# Patient Record
Sex: Female | Born: 1935 | Race: White | Hispanic: No | Marital: Single
Health system: Southern US, Community
[De-identification: ages and names within clinical notes are randomized; demographics above are authoritative.]

---

## 2007-01-19 ENCOUNTER — Ambulatory Visit: Payer: Self-pay | Admitting: Internal Medicine

## 2008-06-05 ENCOUNTER — Inpatient Hospital Stay (HOSPITAL_COMMUNITY): Admission: EM | Admit: 2008-06-05 | Discharge: 2008-06-14 | Payer: Self-pay | Admitting: Emergency Medicine

## 2008-06-14 ENCOUNTER — Inpatient Hospital Stay: Admission: AD | Admit: 2008-06-14 | Discharge: 2008-08-28 | Payer: Self-pay | Admitting: Internal Medicine

## 2008-07-11 ENCOUNTER — Encounter: Admission: RE | Admit: 2008-07-11 | Discharge: 2008-07-11 | Payer: Self-pay | Admitting: Neurosurgery

## 2008-08-29 ENCOUNTER — Encounter: Admission: RE | Admit: 2008-08-29 | Discharge: 2008-08-29 | Payer: Self-pay | Admitting: Neurosurgery

## 2009-09-30 IMAGING — CR DG CHEST 1V PORT
1 series · 1 of 1 positions shown · non-contrast
Comparison: 06/05/2008

CLINICAL DATA: Rib fracture

PORTABLE CHEST - 1 VIEW

[AP]
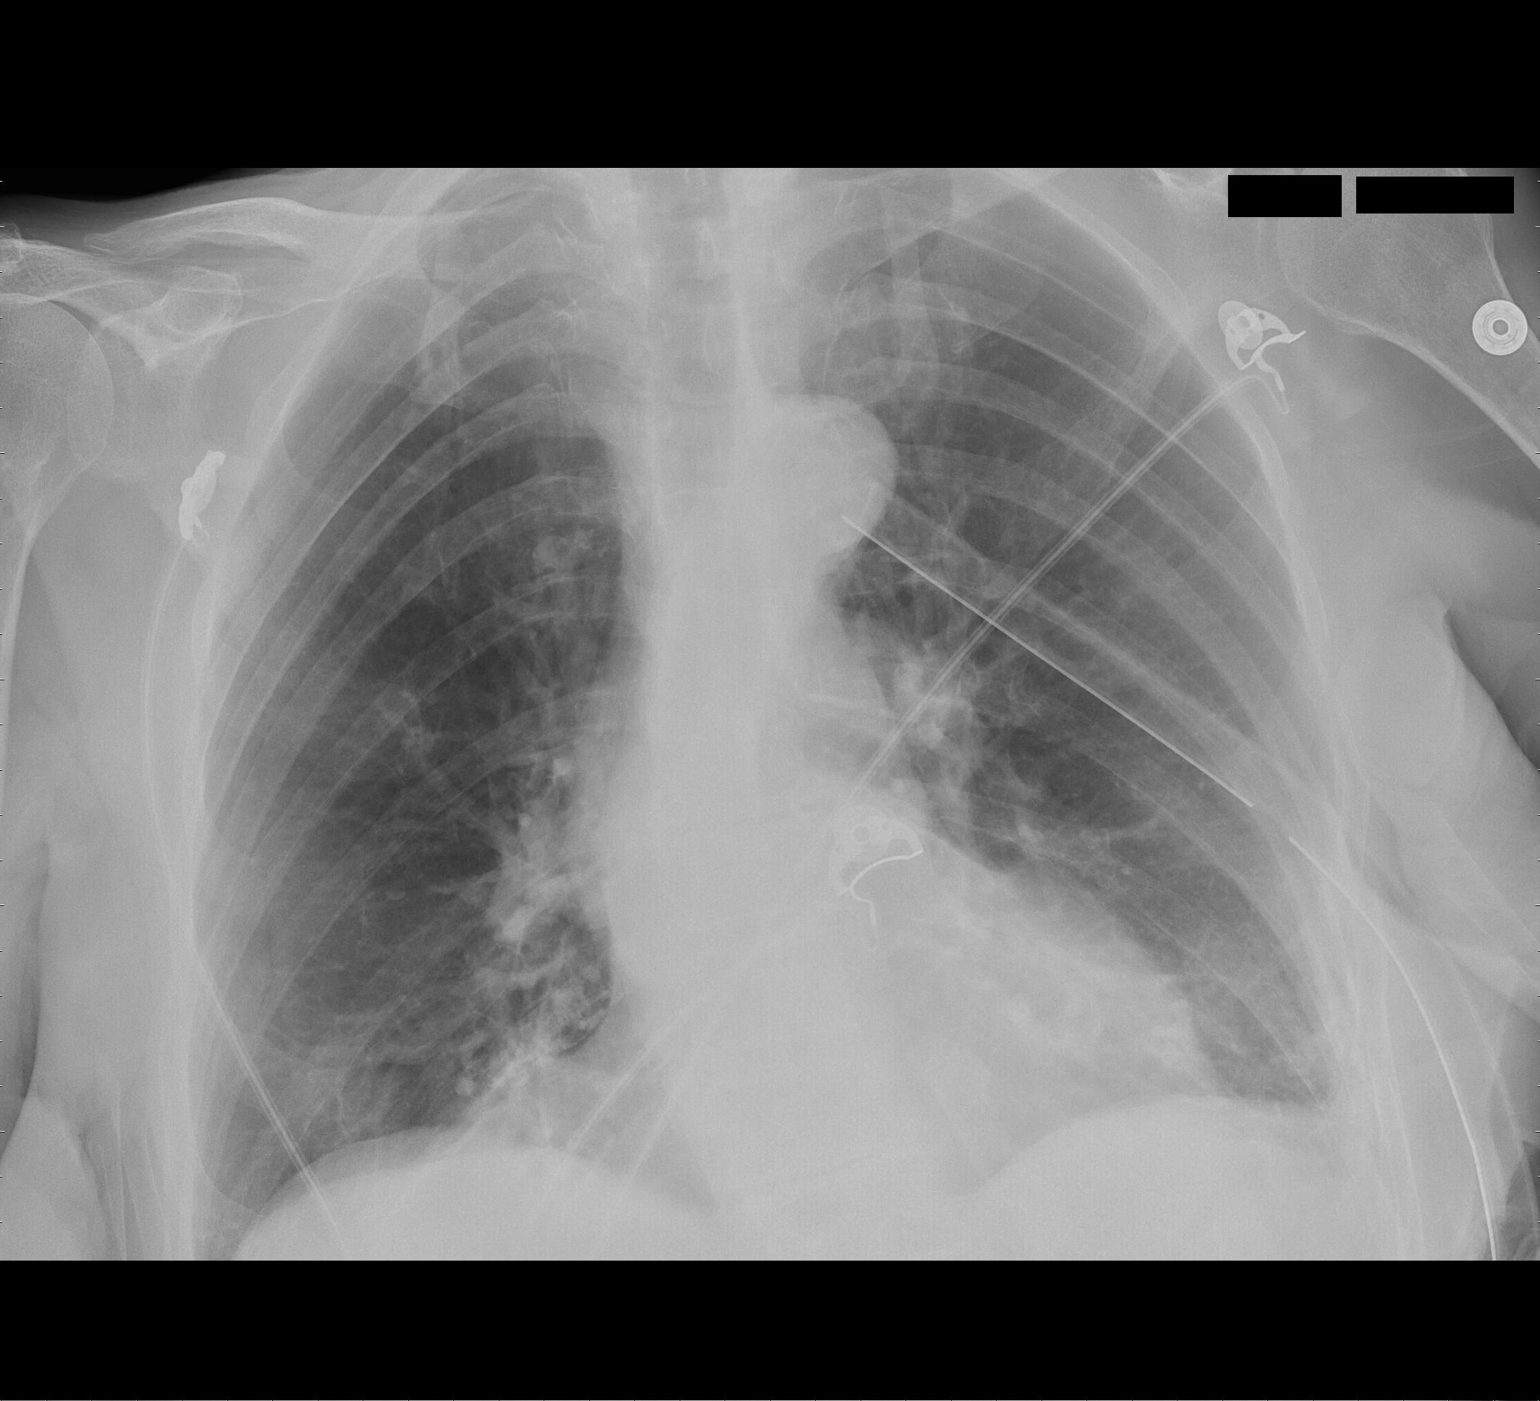

[1 of 1 positions shown; findings below may reference images not displayed]

FINDINGS: The left chest tube is stable and there is no
pneumothorax.  Left rib fractures are noted.  The heart is normal
in size.  Minimal left basilar atelectasis and a left pleural
effusion are stable.
IMPRESSION: No significant interval change.

## 2009-10-01 IMAGING — CR DG CHEST 1V PORT
1 series · 1 of 1 positions shown · non-contrast
Comparison: Portable chest of [DATE] and 06/05/2008

CLINICAL DATA: Cervical fracture, left tib-fib fracture,
pneumothorax, follow-up

PORTABLE CHEST - 1 VIEW

[view not recorded]
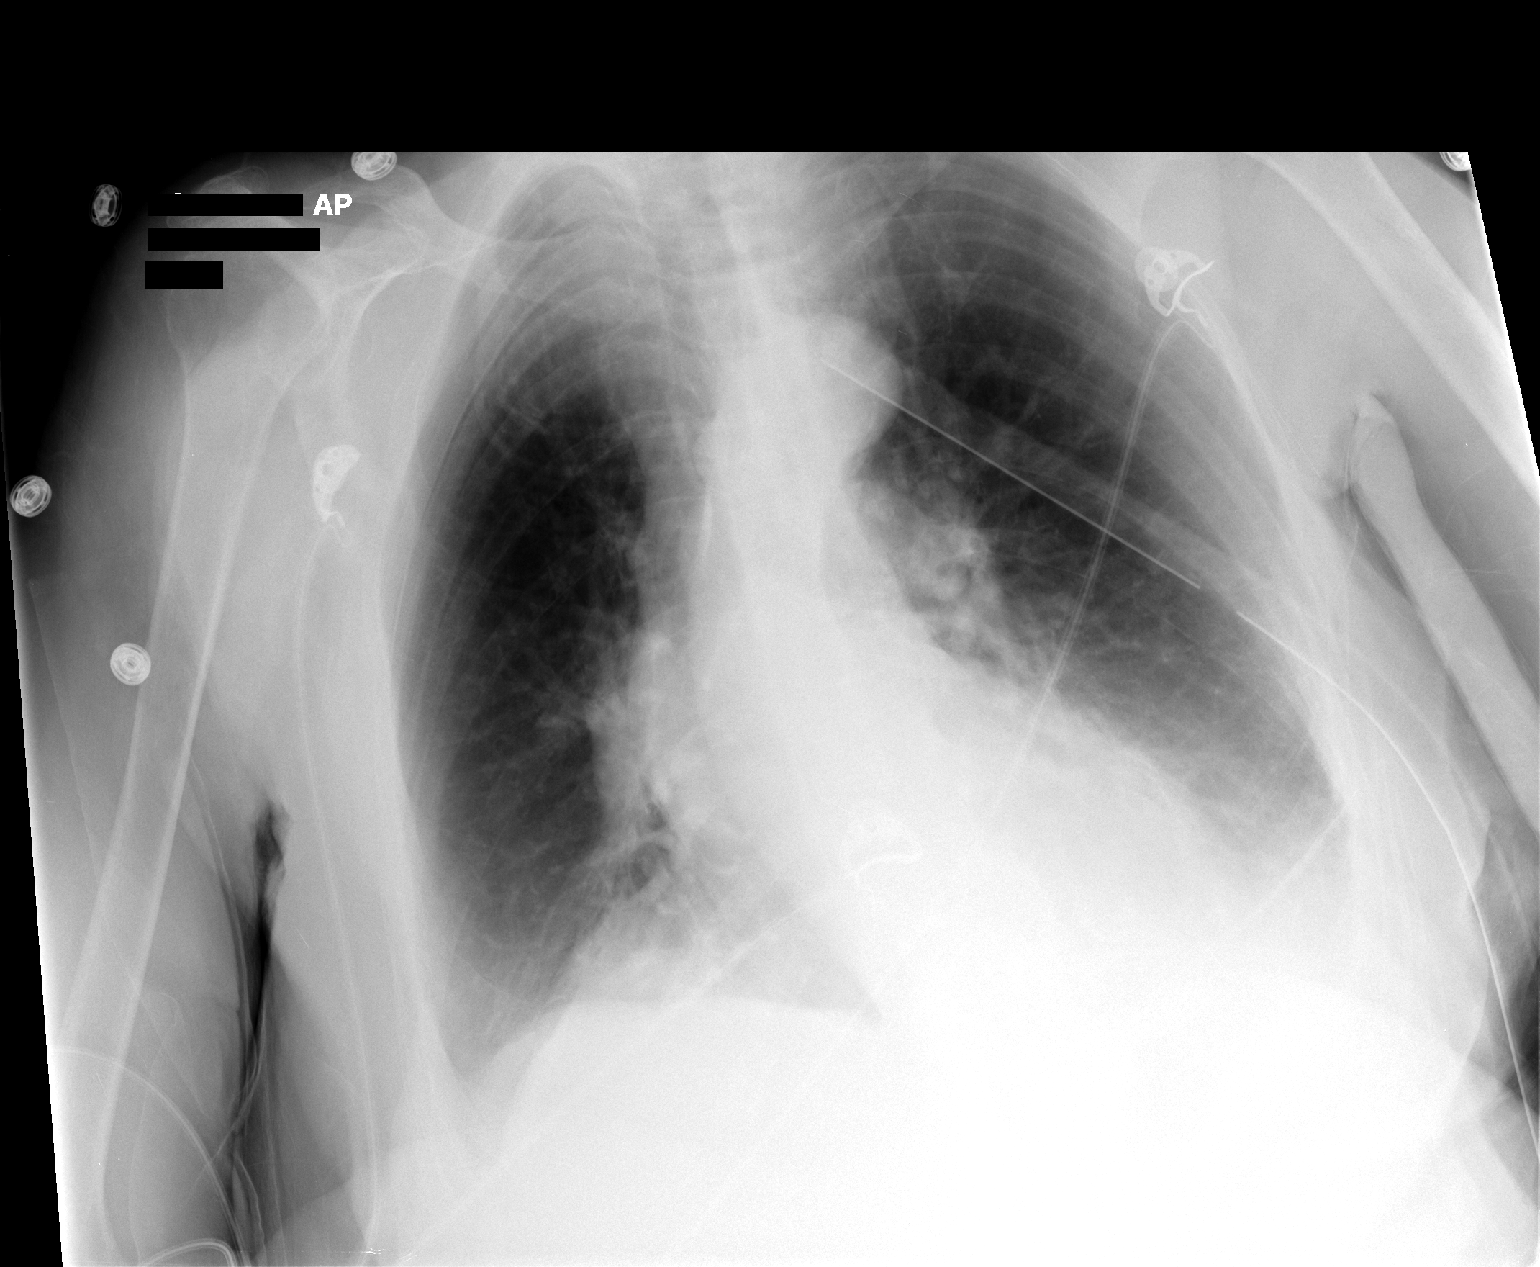

[1 of 1 positions shown; findings below may reference images not displayed]

FINDINGS: There is haziness at the lung bases left greater than
right which may reflect atelectasis and possible effusion.  The
heart is within upper limits of normal.  Left chest tube remains
and no pneumothorax is seen.
IMPRESSION: Atelectasis and probable small effusions left greater than right.
Left chest tube remains with no definite pneumothorax.

## 2009-10-02 IMAGING — CR DG CHEST 1V PORT
1 series · 1 of 1 positions shown · non-contrast
Comparison: 06/07/2008

CLINICAL DATA: 82-year-old female left chest tube, follow-up exam

PORTABLE CHEST - 1 VIEW

[view not recorded]
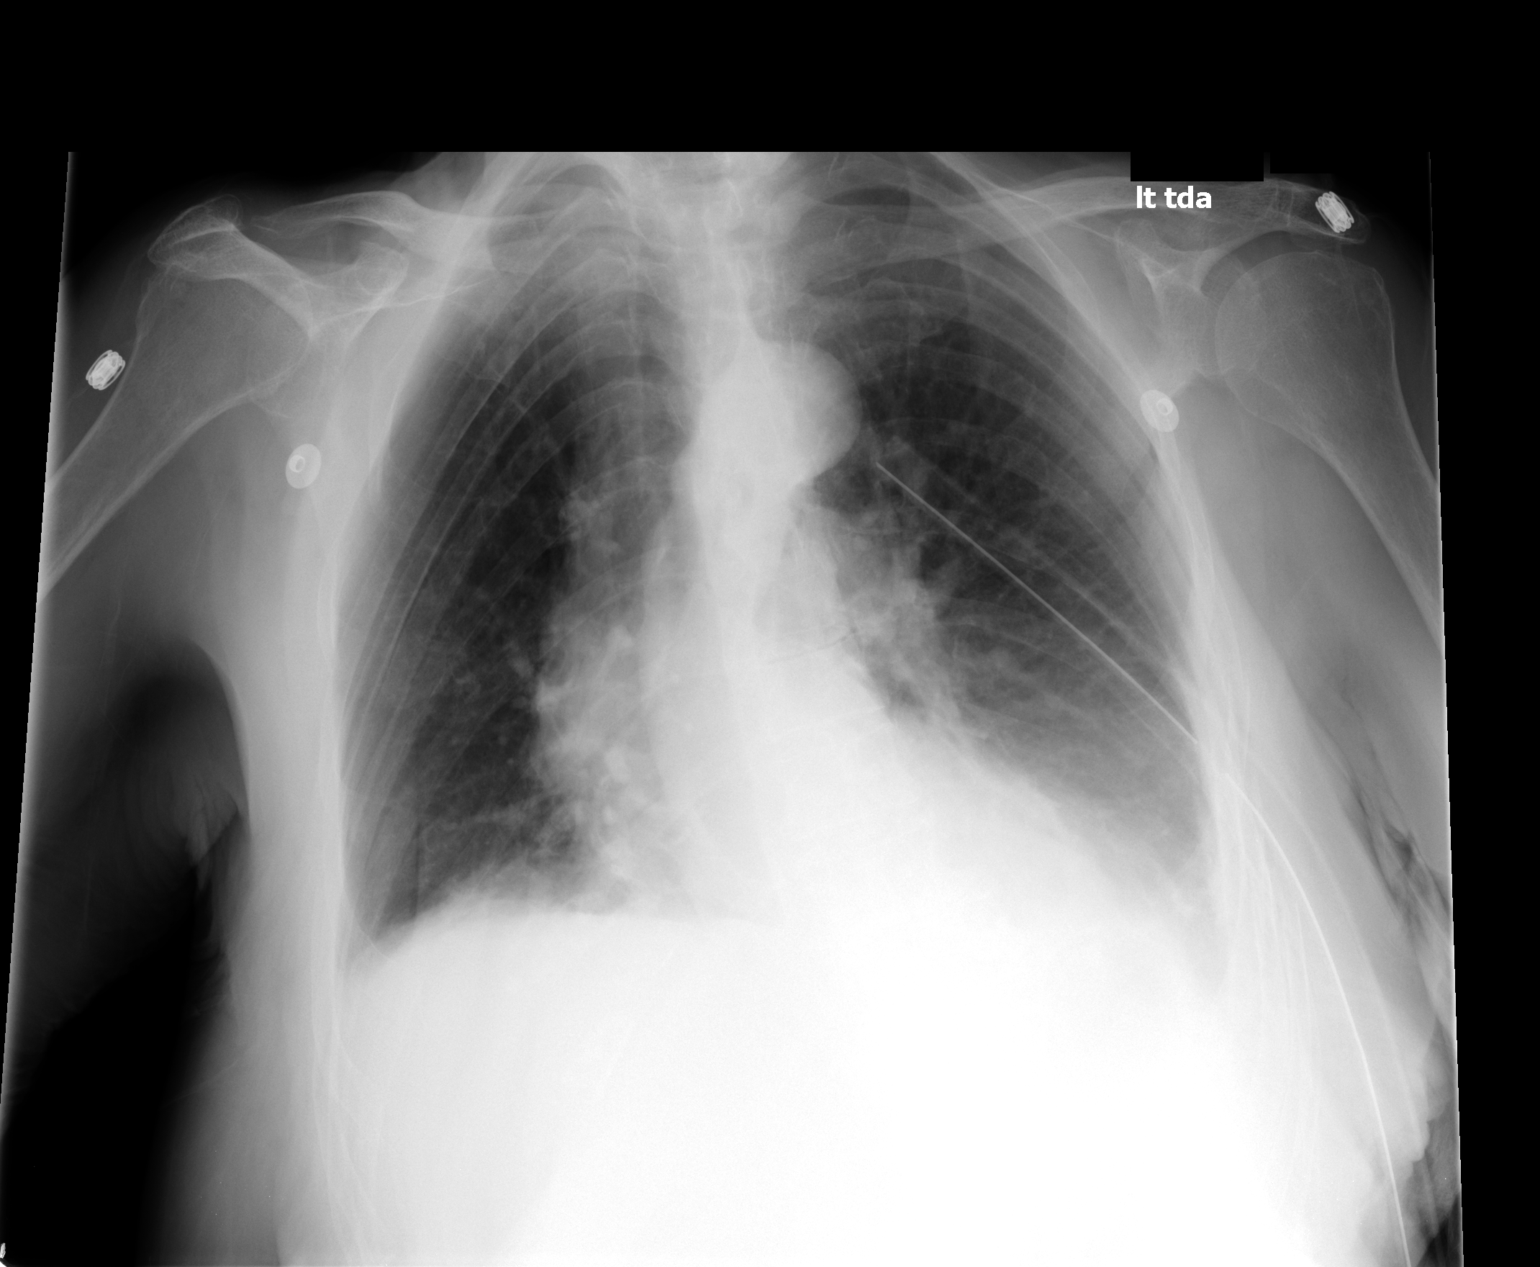

[1 of 1 positions shown; findings below may reference images not displayed]

FINDINGS: Left chest tube remains in place.  No definite
pneumothorax.  Basilar atelectasis is again noted with the left
effusion.  Acute displaced rib fractures are present.
IMPRESSION: No definite pneumothorax.

Basilar atelectasis and left effusion

Acute left rib fractures

## 2009-10-03 IMAGING — CR DG CHEST 1V PORT
2 series · 2 of 2 positions shown · non-contrast
Comparison: 06/09/2007

CLINICAL DATA: Cervical fracture, shortness of breath

PORTABLE CHEST - 1 VIEW

[AP (1 of 2)]
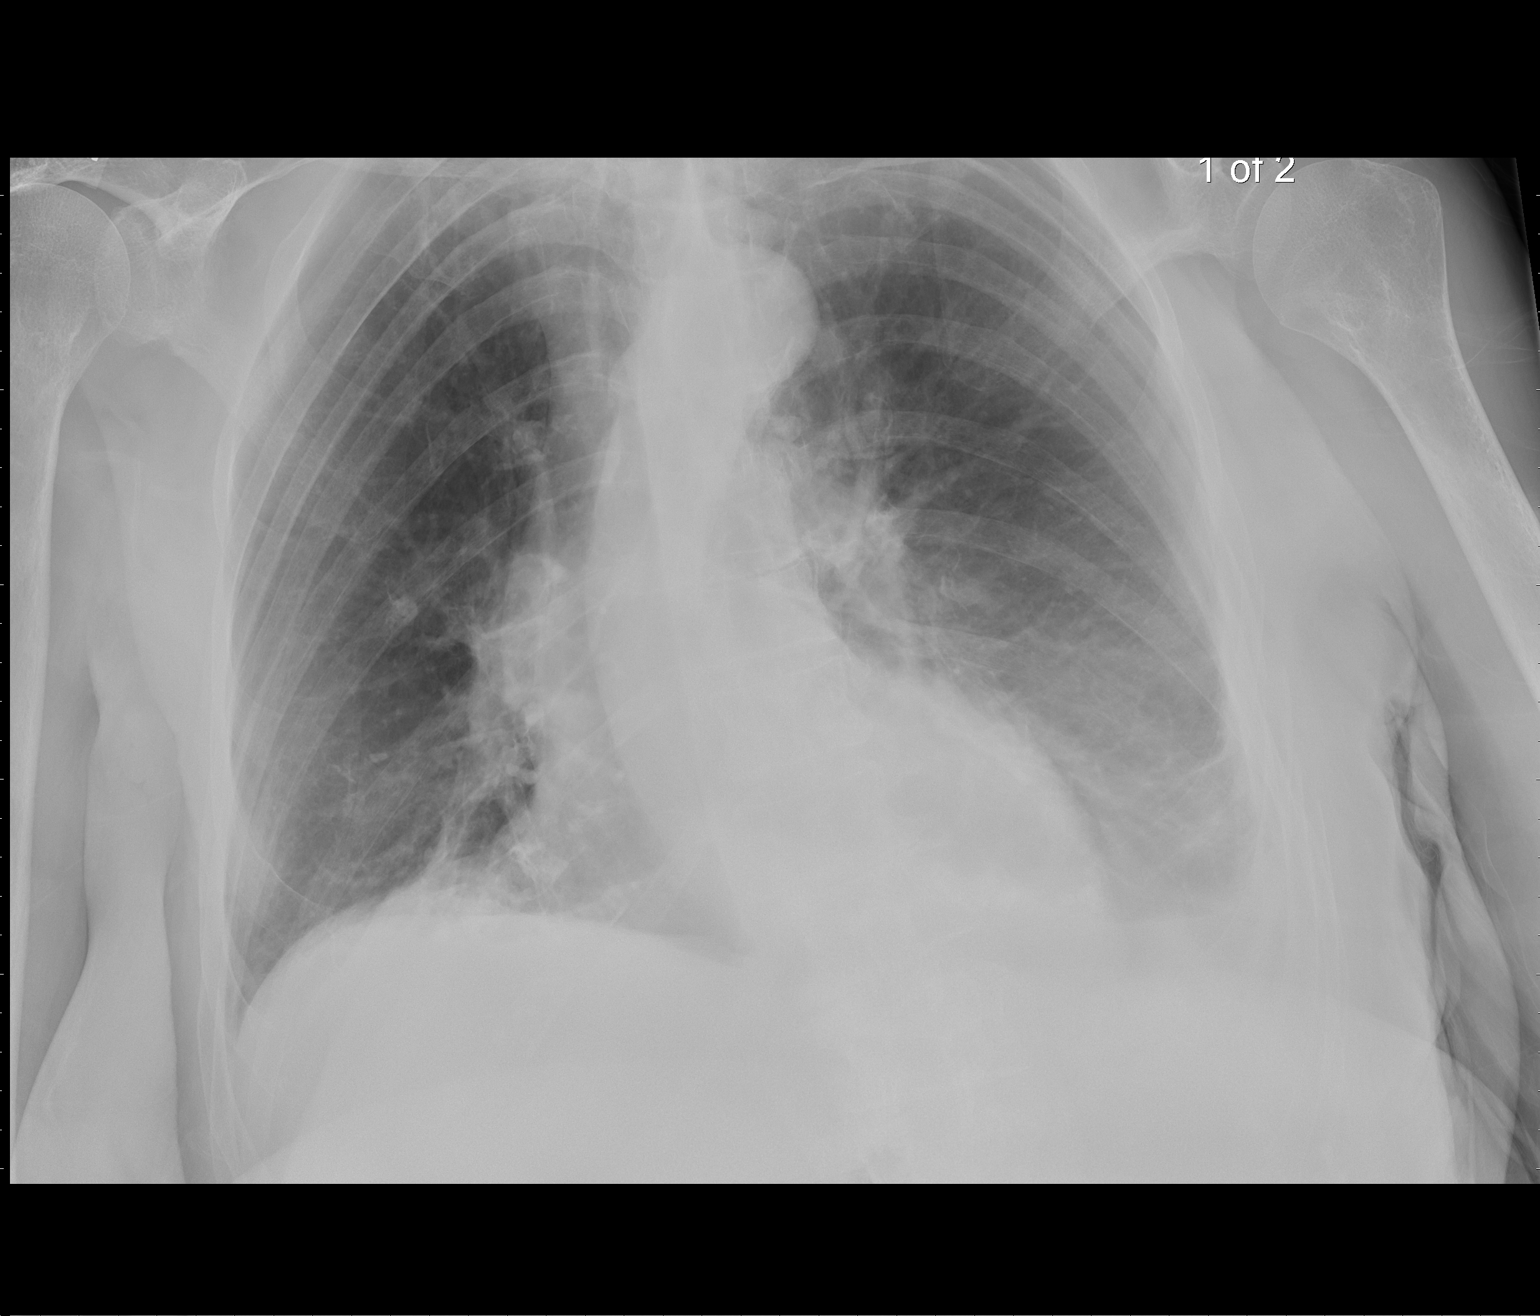

[AP (2 of 2)]
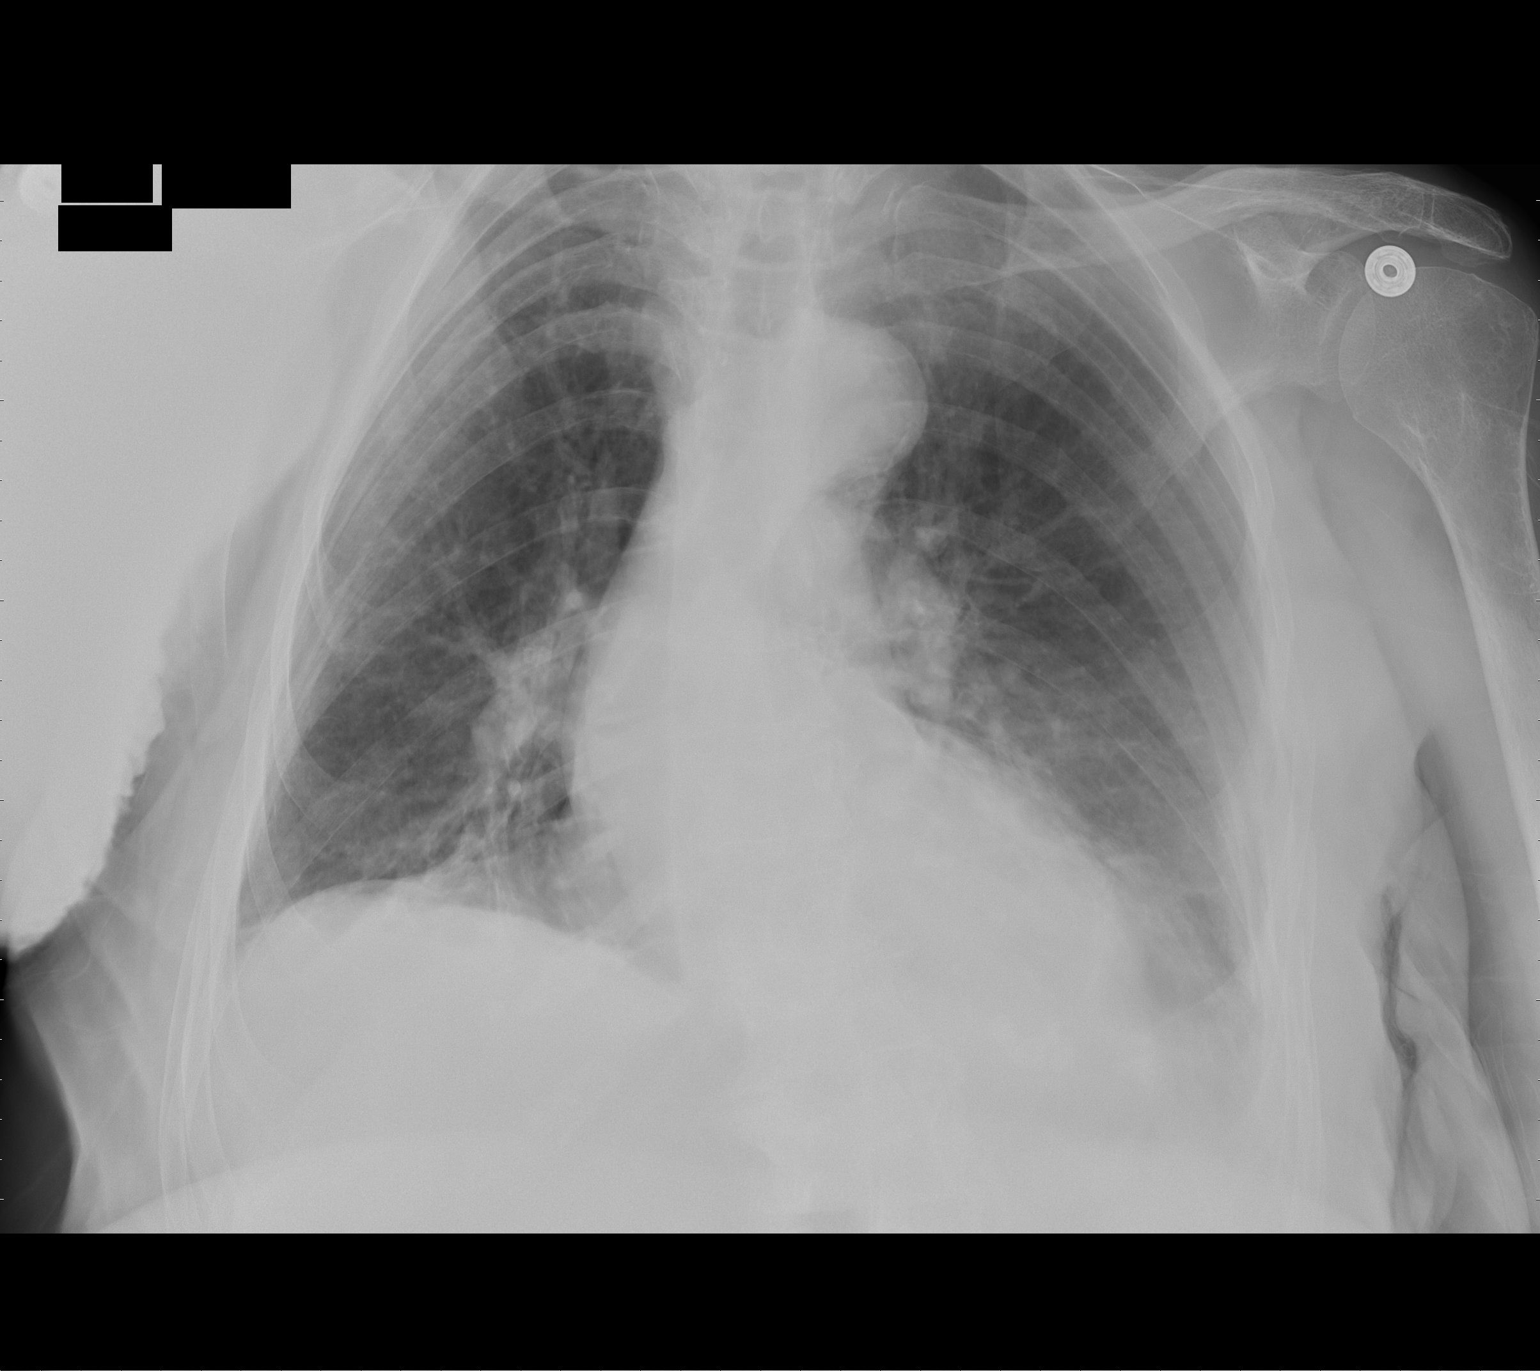

[2 of 2 positions shown; findings below may reference images not displayed]

FINDINGS: The heart is normal in size.  The left chest tube has
been removed.  A tiny left apical pneumothorax is present.  Left
basilar atelectasis worse airspace disease and a left effusion are
stable.
IMPRESSION: Left chest tube removal with a tiny less than 5% left apical
pneumothorax resulting.
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
## 2011-08-27 LAB — URINALYSIS, ROUTINE W REFLEX MICROSCOPIC
Bilirubin Urine: NEGATIVE
Nitrite: NEGATIVE
Specific Gravity, Urine: 1.02
Urobilinogen, UA: 1
pH: 5.5

## 2011-08-27 LAB — CBC
HCT: 32.7 — ABNORMAL LOW
HCT: 34.2 — ABNORMAL LOW
HCT: 35.2 — ABNORMAL LOW
Hemoglobin: 10.2 — ABNORMAL LOW
Hemoglobin: 11.8 — ABNORMAL LOW
MCHC: 33.5
MCHC: 33.5
MCV: 96.9
MCV: 97.2
MCV: 97.3
Platelets: 140 — ABNORMAL LOW
Platelets: 157
RBC: 3.13 — ABNORMAL LOW
RBC: 3.62 — ABNORMAL LOW
RDW: 13.6
RDW: 13.6
WBC: 5.1
WBC: 5.7
WBC: 6.4
WBC: 9.6

## 2011-08-27 LAB — BASIC METABOLIC PANEL
CO2: 26
Chloride: 103
GFR calc Af Amer: >60
Potassium: 4.4

## 2011-08-27 LAB — URINE MICROSCOPIC-ADD ON

## 2011-08-27 LAB — URINE CULTURE: Colony Count: >=100000

## 2011-09-30 ENCOUNTER — Encounter (INDEPENDENT_AMBULATORY_CARE_PROVIDER_SITE_OTHER): Payer: Medicare Other | Admitting: Ophthalmology

## 2011-09-30 DIAGNOSIS — D3130 Benign neoplasm of unspecified choroid: Secondary | ICD-10-CM

## 2011-09-30 DIAGNOSIS — H3530 Unspecified macular degeneration: Secondary | ICD-10-CM

## 2011-09-30 DIAGNOSIS — H43819 Vitreous degeneration, unspecified eye: Secondary | ICD-10-CM

## 2012-03-30 ENCOUNTER — Encounter (INDEPENDENT_AMBULATORY_CARE_PROVIDER_SITE_OTHER): Payer: Medicare Other | Admitting: Ophthalmology

## 2013-05-04 ENCOUNTER — Encounter (INDEPENDENT_AMBULATORY_CARE_PROVIDER_SITE_OTHER): Payer: Medicare Other

## 2013-05-04 DIAGNOSIS — E785 Hyperlipidemia, unspecified: Secondary | ICD-10-CM

## 2013-05-04 DIAGNOSIS — D539 Nutritional anemia, unspecified: Secondary | ICD-10-CM

## 2013-05-18 ENCOUNTER — Encounter (INDEPENDENT_AMBULATORY_CARE_PROVIDER_SITE_OTHER): Payer: Medicare Other

## 2013-05-18 DIAGNOSIS — D469 Myelodysplastic syndrome, unspecified: Secondary | ICD-10-CM

## 2013-07-18 DIAGNOSIS — F329 Major depressive disorder, single episode, unspecified: Secondary | ICD-10-CM

## 2013-11-30 DEATH — deceased

## 2013-12-01 ENCOUNTER — Telehealth: Payer: Self-pay

## 2013-12-01 NOTE — Telephone Encounter (Signed)
Patient past away @ [facility] per Iver Nestle in [place]
